# Patient Record
Sex: Female | Born: 1965 | Race: White | Hispanic: No | Marital: Married | Smoking: Never smoker
Health system: Southern US, Community
[De-identification: ages and names within clinical notes are randomized; demographics above are authoritative.]

## PROBLEM LIST (undated history)

## (undated) DIAGNOSIS — M797 Fibromyalgia: Secondary | ICD-10-CM

## (undated) DIAGNOSIS — M1990 Unspecified osteoarthritis, unspecified site: Secondary | ICD-10-CM

## (undated) DIAGNOSIS — F32A Depression, unspecified: Secondary | ICD-10-CM

## (undated) DIAGNOSIS — J45909 Unspecified asthma, uncomplicated: Secondary | ICD-10-CM

## (undated) DIAGNOSIS — F419 Anxiety disorder, unspecified: Secondary | ICD-10-CM

## (undated) DIAGNOSIS — K5190 Ulcerative colitis, unspecified, without complications: Secondary | ICD-10-CM

## (undated) DIAGNOSIS — E785 Hyperlipidemia, unspecified: Secondary | ICD-10-CM

## (undated) HISTORY — PX: BREAST ENHANCEMENT SURGERY: SHX7

## (undated) HISTORY — PX: VEIN SURGERY: SHX48

---

## 1996-03-01 DIAGNOSIS — J45909 Unspecified asthma, uncomplicated: Secondary | ICD-10-CM | POA: Insufficient documentation

## 1997-11-17 ENCOUNTER — Ambulatory Visit (HOSPITAL_BASED_OUTPATIENT_CLINIC_OR_DEPARTMENT_OTHER): Admission: RE | Admit: 1997-11-17 | Discharge: 1997-11-17 | Payer: Self-pay | Admitting: *Deleted

## 1998-08-11 ENCOUNTER — Other Ambulatory Visit: Admission: RE | Admit: 1998-08-11 | Discharge: 1998-08-11 | Payer: Self-pay | Admitting: Obstetrics and Gynecology

## 1999-01-14 ENCOUNTER — Inpatient Hospital Stay (HOSPITAL_COMMUNITY): Admission: AD | Admit: 1999-01-14 | Discharge: 1999-01-14 | Payer: Self-pay | Admitting: Obstetrics and Gynecology

## 1999-02-09 ENCOUNTER — Observation Stay (HOSPITAL_COMMUNITY): Admission: AD | Admit: 1999-02-09 | Discharge: 1999-02-10 | Payer: Self-pay | Admitting: Obstetrics and Gynecology

## 1999-02-11 ENCOUNTER — Inpatient Hospital Stay (HOSPITAL_COMMUNITY): Admission: AD | Admit: 1999-02-11 | Discharge: 1999-02-13 | Payer: Self-pay | Admitting: Obstetrics & Gynecology

## 1999-04-14 ENCOUNTER — Other Ambulatory Visit: Admission: RE | Admit: 1999-04-14 | Discharge: 1999-04-14 | Payer: Self-pay | Admitting: Obstetrics and Gynecology

## 2000-05-13 ENCOUNTER — Other Ambulatory Visit: Admission: RE | Admit: 2000-05-13 | Discharge: 2000-05-13 | Payer: Self-pay | Admitting: Obstetrics and Gynecology

## 2001-07-03 ENCOUNTER — Other Ambulatory Visit: Admission: RE | Admit: 2001-07-03 | Discharge: 2001-07-03 | Payer: Self-pay | Admitting: Obstetrics and Gynecology

## 2002-02-19 DIAGNOSIS — K5190 Ulcerative colitis, unspecified, without complications: Secondary | ICD-10-CM | POA: Insufficient documentation

## 2002-08-12 ENCOUNTER — Other Ambulatory Visit: Admission: RE | Admit: 2002-08-12 | Discharge: 2002-08-12 | Payer: Self-pay | Admitting: Obstetrics and Gynecology

## 2003-10-25 ENCOUNTER — Other Ambulatory Visit: Admission: RE | Admit: 2003-10-25 | Discharge: 2003-10-25 | Payer: Self-pay | Admitting: Obstetrics and Gynecology

## 2005-01-03 ENCOUNTER — Other Ambulatory Visit: Admission: RE | Admit: 2005-01-03 | Discharge: 2005-01-03 | Payer: Self-pay | Admitting: Obstetrics and Gynecology

## 2007-05-26 ENCOUNTER — Encounter: Admission: RE | Admit: 2007-05-26 | Discharge: 2007-05-26 | Payer: Self-pay | Admitting: Rheumatology

## 2007-08-14 DIAGNOSIS — M797 Fibromyalgia: Secondary | ICD-10-CM | POA: Insufficient documentation

## 2010-04-11 DIAGNOSIS — I872 Venous insufficiency (chronic) (peripheral): Secondary | ICD-10-CM | POA: Insufficient documentation

## 2011-07-02 DIAGNOSIS — F419 Anxiety disorder, unspecified: Secondary | ICD-10-CM | POA: Insufficient documentation

## 2017-03-01 DIAGNOSIS — K639 Disease of intestine, unspecified: Secondary | ICD-10-CM | POA: Insufficient documentation

## 2018-08-16 DIAGNOSIS — M810 Age-related osteoporosis without current pathological fracture: Secondary | ICD-10-CM | POA: Insufficient documentation

## 2020-05-27 ENCOUNTER — Encounter (INDEPENDENT_AMBULATORY_CARE_PROVIDER_SITE_OTHER): Payer: Self-pay

## 2020-05-27 ENCOUNTER — Other Ambulatory Visit: Payer: Self-pay | Admitting: Internal Medicine

## 2020-05-27 ENCOUNTER — Ambulatory Visit
Admission: RE | Admit: 2020-05-27 | Discharge: 2020-05-27 | Disposition: A | Discharge status: Home / Self Care | Payer: BC Managed Care – PPO | Source: Ambulatory Visit | Attending: Internal Medicine | Admitting: Internal Medicine

## 2020-05-27 DIAGNOSIS — M19079 Primary osteoarthritis, unspecified ankle and foot: Secondary | ICD-10-CM

## 2021-03-10 ENCOUNTER — Emergency Department (HOSPITAL_BASED_OUTPATIENT_CLINIC_OR_DEPARTMENT_OTHER)
Admission: EM | Admit: 2021-03-10 | Discharge: 2021-03-10 | Disposition: A | Discharge status: Home / Self Care | Payer: BC Managed Care – PPO | Attending: Emergency Medicine | Admitting: Emergency Medicine

## 2021-03-10 ENCOUNTER — Other Ambulatory Visit: Payer: Self-pay

## 2021-03-10 ENCOUNTER — Emergency Department (HOSPITAL_BASED_OUTPATIENT_CLINIC_OR_DEPARTMENT_OTHER): Payer: BC Managed Care – PPO

## 2021-03-10 ENCOUNTER — Encounter (HOSPITAL_BASED_OUTPATIENT_CLINIC_OR_DEPARTMENT_OTHER): Payer: Self-pay

## 2021-03-10 ENCOUNTER — Other Ambulatory Visit (HOSPITAL_BASED_OUTPATIENT_CLINIC_OR_DEPARTMENT_OTHER): Payer: Self-pay

## 2021-03-10 DIAGNOSIS — S3991XA Unspecified injury of abdomen, initial encounter: Secondary | ICD-10-CM | POA: Diagnosis not present

## 2021-03-10 DIAGNOSIS — W19XXXA Unspecified fall, initial encounter: Secondary | ICD-10-CM | POA: Diagnosis not present

## 2021-03-10 DIAGNOSIS — R1012 Left upper quadrant pain: Secondary | ICD-10-CM

## 2021-03-10 HISTORY — DX: Ulcerative colitis, unspecified, without complications: K51.90

## 2021-03-10 HISTORY — DX: Hyperlipidemia, unspecified: E78.5

## 2021-03-10 HISTORY — DX: Unspecified osteoarthritis, unspecified site: M19.90

## 2021-03-10 HISTORY — DX: Anxiety disorder, unspecified: F41.9

## 2021-03-10 HISTORY — DX: Depression, unspecified: F32.A

## 2021-03-10 HISTORY — DX: Unspecified asthma, uncomplicated: J45.909

## 2021-03-10 HISTORY — DX: Fibromyalgia: M79.7

## 2021-03-10 LAB — COMPREHENSIVE METABOLIC PANEL
ALT: 23 U/L (ref 0–44)
AST: 27 U/L (ref 15–41)
Albumin: 4.2 g/dL (ref 3.5–5.0)
Alkaline Phosphatase: 57 U/L (ref 38–126)
Anion gap: 6 (ref 5–15)
BUN: 9 mg/dL (ref 6–20)
CO2: 29 mmol/L (ref 22–32)
Calcium: 9.7 mg/dL (ref 8.9–10.3)
Chloride: 103 mmol/L (ref 98–111)
Creatinine, Ser: 0.67 mg/dL (ref 0.44–1.00)
GFR, Estimated: >60 mL/min (ref >60)
Glucose, Bld: 103 mg/dL — ABNORMAL HIGH (ref 70–99)
Potassium: 3.9 mmol/L (ref 3.5–5.1)
Sodium: 138 mmol/L (ref 135–145)
Total Bilirubin: 0.5 mg/dL (ref 0.3–1.2)
Total Protein: 7.5 g/dL (ref 6.5–8.1)

## 2021-03-10 LAB — CBC
HCT: 39 % (ref 36.0–46.0)
Hemoglobin: 12.7 g/dL (ref 12.0–15.0)
MCH: 30.6 pg (ref 26.0–34.0)
MCHC: 32.6 g/dL (ref 30.0–36.0)
MCV: 94 fL (ref 80.0–100.0)
Platelets: 272 10*3/uL (ref 150–400)
RBC: 4.15 MIL/uL (ref 3.87–5.11)
RDW: 13.7 % (ref 11.5–15.5)
WBC: 6.4 10*3/uL (ref 4.0–10.5)
nRBC: 0 % (ref 0.0–0.2)

## 2021-03-10 LAB — URINALYSIS, MICROSCOPIC (REFLEX): Bacteria, UA: NONE SEEN

## 2021-03-10 LAB — URINALYSIS, ROUTINE W REFLEX MICROSCOPIC
Bilirubin Urine: NEGATIVE
Glucose, UA: NEGATIVE mg/dL
Ketones, ur: NEGATIVE mg/dL
Leukocytes,Ua: NEGATIVE
Nitrite: NEGATIVE
Protein, ur: NEGATIVE mg/dL
Specific Gravity, Urine: 1.025 (ref 1.005–1.030)
pH: 6 (ref 5.0–8.0)

## 2021-03-10 LAB — LIPASE, BLOOD: Lipase: 33 U/L (ref 11–51)

## 2021-03-10 LAB — PREGNANCY, URINE: Preg Test, Ur: NEGATIVE

## 2021-03-10 MED ORDER — IOHEXOL 300 MG/ML  SOLN
100.0000 mL | Freq: Once | INTRAMUSCULAR | Status: AC | PRN
  Administered 2021-03-10: 100 mL via INTRAVENOUS

## 2021-03-10 MED ORDER — HYDROCODONE-ACETAMINOPHEN 5-325 MG PO TABS
1.0000 | ORAL_TABLET | ORAL | 0 refills | Status: DC | PRN
  Filled 2021-03-10: qty 10, 2d supply, fill #0

## 2021-03-10 NOTE — ED Triage Notes (Signed)
Pt c/o pain to left abd and flank-states she was reaching into a tall garbage bin "I caught the edge anf it popped into me" 2/20-NAD-steady gait

## 2021-03-10 NOTE — ED Provider Notes (Signed)
[facility] EMERGENCY DEPARTMENT Provider Note   CSN: [id number] Arrival date & time: 03/10/21  1250     History  Chief Complaint  Patient presents with   Abdominal Injury    Suzanne Dawson is a 56 y.o. female.  Pt is a 56 yo wf with a hx of UC, fibromyalgia, anxiety, and depression.  Pt said she was cleaning out her large trash can and she half fell into it with the lip of the trash can hitting her LUQ.  Pt said this happened on 2/20 and it is still hurting her.  No other sx.  She has taken ibuprofen which has helped, but she is not supposed to take NSAIDS due to her UC.  She has taken tylenol, but it has not helped.      Home Medications Prior to Admission medications   Medication Sig Start Date End Date Taking? Authorizing Provider  HYDROcodone-acetaminophen (NORCO/VICODIN) 5-325 MG tablet Take 1 tablet by mouth every 4 (four) hours as needed. 03/10/21  Yes Jacalyn Lefevre, MD      Allergies    Amoxil [amoxicillin], Colazal [balsalazide], and Imuran [azathioprine]    Review of Systems   Review of Systems  Gastrointestinal:  Positive for abdominal pain.  All other systems reviewed and are negative.  Physical Exam Updated Vital Signs BP (!) 152/73 (BP Location: Left Arm)    Pulse 88    Temp 98.7 F (37.1 C) (Oral)    Resp 18    Ht 5\' 5"  (1.651 m)    Wt 64.9 kg    SpO2 100%    BMI 23.80 kg/m  Physical Exam Vitals and nursing note reviewed.  Constitutional:      Appearance: Normal appearance.  HENT:     Head: Normocephalic and atraumatic.     Right Ear: External ear normal.     Left Ear: External ear normal.     Nose: Nose normal.     Mouth/Throat:     Mouth: Mucous membranes are moist.     Pharynx: Oropharynx is clear.  Eyes:     Extraocular Movements: Extraocular movements intact.     Conjunctiva/sclera: Conjunctivae normal.     Pupils: Pupils are equal, round, and reactive to light.  Cardiovascular:     Rate and Rhythm: Normal rate and regular  rhythm.     Pulses: Normal pulses.     Heart sounds: Normal heart sounds.  Pulmonary:     Effort: Pulmonary effort is normal.     Breath sounds: Normal breath sounds.  Abdominal:     General: Abdomen is flat. Bowel sounds are normal.     Palpations: Abdomen is soft.     Tenderness: There is abdominal tenderness in the left upper quadrant.  Musculoskeletal:        General: Normal range of motion.     Cervical back: Normal range of motion and neck supple.  Skin:    General: Skin is warm.     Capillary Refill: Capillary refill takes less than 2 seconds.  Neurological:     General: No focal deficit present.     Mental Status: She is alert and oriented to person, place, and time.  Psychiatric:        Mood and Affect: Mood normal.        Behavior: Behavior normal.    ED Results / Procedures / Treatments   Labs (all labs ordered are listed, but only abnormal results are displayed) Labs Reviewed  COMPREHENSIVE METABOLIC  PANEL - Abnormal; Notable for the following components:      Result Value   Glucose, Bld 103 (*)    All other components within normal limits  URINALYSIS, ROUTINE W REFLEX MICROSCOPIC - Abnormal; Notable for the following components:   Hgb urine dipstick SMALL (*)    All other components within normal limits  LIPASE, BLOOD  CBC  PREGNANCY, URINE  URINALYSIS, MICROSCOPIC (REFLEX)    EKG None  Radiology CT ABDOMEN PELVIS W CONTRAST  Result Date: 03/10/2021 CLINICAL DATA:  Abdominal trauma, blunt EXAM: CT ABDOMEN AND PELVIS WITH CONTRAST TECHNIQUE: Multidetector CT imaging of the abdomen and pelvis was performed using the standard protocol following bolus administration of intravenous contrast. RADIATION DOSE REDUCTION: This exam was performed according to the departmental dose-optimization program which includes automated exposure control, adjustment of the mA and/or kV according to patient size and/or use of iterative reconstruction technique. CONTRAST:  142mL  OMNIPAQUE IOHEXOL 300 MG/ML  SOLN COMPARISON:  None. FINDINGS: Lower chest: No acute abnormality. Bilateral breast implants partially visualized. Hepatobiliary: No focal liver abnormality is seen. The gallbladder is unremarkable. Pancreas: Unremarkable. No pancreatic ductal dilatation or surrounding inflammatory changes. Spleen: Spleen is normal in size with multiple clefts. Probable large adjacent splenule. Adrenals/Urinary Tract: Adrenal glands are unremarkable. No hydronephrosis or nephrolithiasis. Bladder is unremarkable. Stomach/Bowel: The stomach is within normal limits. There is no evidence of bowel obstruction.The appendix is normal. Redundant sigmoid colon. Moderate stool burden in the ascending and transverse colon. Vascular/Lymphatic: No significant vascular findings are present. No enlarged abdominal or pelvic lymph nodes. Reproductive: Unremarkable. Other: No abdominal wall hernia or abnormality. No abdominopelvic ascites. Musculoskeletal: Moderate to severe disc height loss at L5-S1. There is no acute osseous abnormality. No suspicious lytic or blastic lesions. IMPRESSION: No acute abdominopelvic abnormality. Electronically Signed   By: Maurine Simmering M.D.   On: 03/10/2021 14:43    Procedures Procedures    Medications Ordered in ED Medications  iohexol (OMNIPAQUE) 300 MG/ML solution 100 mL (100 mLs Intravenous Contrast Given 03/10/21 1421)    ED Course/ Medical Decision Making/ A&P                           Medical Decision Making Amount and/or Complexity of Data Reviewed Labs: ordered. Radiology: ordered.  Risk Prescription drug management.   This patient presents to the ED for concern of abd pain, this involves an extensive number of treatment options, and is a complaint that carries with it a high risk of complications and morbidity.  The differential diagnosis includes internal injury, contusion, infection   Co morbidities that complicate the patient evaluation  Ulcerative  colitis on humira   Additional history obtained:  Additional history obtained from epic chart review  Lab Tests:  I Ordered, and personally interpreted labs.  The pertinent results include:  cmp nl, cbc nl, ua neg, preg neg, lipase nl   Imaging Studies ordered:  I ordered imaging studies including ct abd/pelvis  I independently visualized and interpreted imaging which showed    IMPRESSION:  No acute abdominopelvic abnormality.   I agree with the radiologist interpretation   Cardiac Monitoring:  The patient was maintained on a cardiac monitor.  I personally viewed and interpreted the cardiac monitored which showed an underlying rhythm of: nsr   Medicines ordered and prescription drug management:  I have reviewed the patients home medicines and have made adjustments as needed   Problem List / ED Course:  Abd  pain: contusion. She is d/c with lortab (#10).  She knows they can cause constipation and is to take colace or other stool softener.    Reevaluation:  After the interventions noted above, I reevaluated the patient and found that they have :improved   Social Determinants of Health:  Lives at home with husband   Dispostion:  After consideration of the diagnostic results and the patients response to treatment, I feel that the patent would benefit from discharge with outpatient f/u.  Return if worse.           Final Clinical Impression(s) / ED Diagnoses Final diagnoses:  Left upper quadrant abdominal pain  Blunt trauma to abdomen, initial encounter    Rx / DC Orders ED Discharge Orders          Ordered    HYDROcodone-acetaminophen (NORCO/VICODIN) 5-325 MG tablet  Every 4 hours PRN        03/10/21 1456              Isla Pence, MD 03/10/21 1525

## 2022-12-20 LAB — HM DEXA SCAN

## 2023-02-06 NOTE — Progress Notes (Signed)
   I, Stevenson Clinch, CMA acting as a scribe for Clementeen Graham, MD.  Suzanne Dawson is a 58 y.o. female who presents to [facility] today for osteoporosis management. Family hx of cancer and osteoporosis. Hx of long term steroids.   DEXA scan (date, T-score): 12/20/22: Spine= -3.0, L-FN= -2.4, R-FN= -1.9 Prior treatment: no History of Hip, Spine, or Wrist Fx: no Heart disease or stroke: no Cancer: none personally Kidney Disease: no Gastric/Peptic Ulcer: ulcerative colitis Gastric bypass surgery: no Severe GERD: YES - Nexium Hx of seizures: no Age at Menopause: 58 y/o Calcium intake: yes- 500mg  Vitamin D intake: yes- 1000iU Hormone replacement therapy: estradiol patch - current Smoking history: never smoked Alcohol: once weekly, seasonal, socially Exercise: moderate - resistance training.  Major dental work in past year: no, planned dental surgery in the coming.  Parents with hip/spine fracture: mom, hip Height loss: yes: 66" to 65.5"   Pertinent review of systems: No fevers or chills  Relevant historical information: Ulcerative colitis treated with Humira.  Sleep apnea anxiety fibromyalgia.   Exam:  BP 110/82   Pulse 77   Ht 5' 5.5" (1.664 m)   Wt 142 lb (64.4 kg)   SpO2 98%   BMI 23.27 kg/m  General: Well Developed, well nourished, and in no acute distress.   MSK: Normal lumbar motion.       Assessment and Plan: 58 y.o. female with osteoporosis with T-score -3.0 lumbar spine.  She already has been doing a good job with conservative management for her osteoporosis with weightbearing exercise and optimizing vitamin D and calcium.  Her exposure to steroids in the past may be a factor for osteoporosis as his age and genetics.  We talked about her medication options plan for Prolia.  Will check labs in preparation for Prolia and as well as checking vitamin D.   PDMP not reviewed this encounter. Orders Placed This Encounter  Procedures    Comprehensive metabolic panel    Osteoporis    Standing Status:   Future    Number of Occurrences:   1    Expiration Date:   02/07/2024   Magnesium    Therapeutic drug monitoring    Standing Status:   Future    Number of Occurrences:   1    Expiration Date:   02/07/2024   VITAMIN D 25 Hydroxy (Vit-D Deficiency, Fractures)    Osteoporis    Standing Status:   Future    Number of Occurrences:   1    Expiration Date:   02/07/2024   Phosphorus    Osteroporis    Standing Status:   Future    Number of Occurrences:   1    Expiration Date:   02/07/2024   No orders of the defined types were placed in this encounter.    Discussed warning signs or symptoms. Please see discharge instructions. Patient expresses understanding.   The above documentation has been reviewed and is accurate and complete Clementeen Graham, M.D.

## 2023-02-07 ENCOUNTER — Encounter: Payer: Self-pay | Admitting: Family Medicine

## 2023-02-07 ENCOUNTER — Ambulatory Visit: Payer: 59 | Admitting: Family Medicine

## 2023-02-07 VITALS — BP 110/82 | HR 77 | Ht 65.5 in | Wt 142.0 lb

## 2023-02-07 DIAGNOSIS — M1990 Unspecified osteoarthritis, unspecified site: Secondary | ICD-10-CM | POA: Insufficient documentation

## 2023-02-07 DIAGNOSIS — M810 Age-related osteoporosis without current pathological fracture: Secondary | ICD-10-CM

## 2023-02-07 DIAGNOSIS — G4733 Obstructive sleep apnea (adult) (pediatric): Secondary | ICD-10-CM | POA: Insufficient documentation

## 2023-02-07 DIAGNOSIS — G4700 Insomnia, unspecified: Secondary | ICD-10-CM | POA: Insufficient documentation

## 2023-02-07 LAB — COMPREHENSIVE METABOLIC PANEL
ALT: 15 U/L (ref 0–35)
AST: 18 U/L (ref 0–37)
Albumin: 4.4 g/dL (ref 3.5–5.2)
Alkaline Phosphatase: 39 U/L (ref 39–117)
BUN: 10 mg/dL (ref 6–23)
CO2: 31 meq/L (ref 19–32)
Calcium: 9.6 mg/dL (ref 8.4–10.5)
Chloride: 102 meq/L (ref 96–112)
Creatinine, Ser: 0.68 mg/dL (ref 0.40–1.20)
GFR: 96.32 mL/min (ref >60.00)
Glucose, Bld: 98 mg/dL (ref 70–99)
Potassium: 3.7 meq/L (ref 3.5–5.1)
Sodium: 139 meq/L (ref 135–145)
Total Bilirubin: 0.3 mg/dL (ref 0.2–1.2)
Total Protein: 7.2 g/dL (ref 6.0–8.3)

## 2023-02-07 LAB — MAGNESIUM: Magnesium: 1.8 mg/dL (ref 1.5–2.5)

## 2023-02-07 LAB — VITAMIN D 25 HYDROXY (VIT D DEFICIENCY, FRACTURES): VITD: 47.48 ng/mL (ref 30.00–100.00)

## 2023-02-07 LAB — PHOSPHORUS: Phosphorus: 3.8 mg/dL (ref 2.3–4.6)

## 2023-02-07 NOTE — Patient Instructions (Signed)
Thank you for coming in today.   Please get labs today before you leave

## 2023-02-08 ENCOUNTER — Encounter: Payer: Self-pay | Admitting: Family Medicine

## 2023-02-08 NOTE — Progress Notes (Signed)
Labs look okay.  Continue current calcium and vitamin D dosing.  Okay to proceed to Prolia when we get it authorized.

## 2023-02-13 ENCOUNTER — Telehealth: Payer: Self-pay

## 2023-02-13 DIAGNOSIS — M810 Age-related osteoporosis without current pathological fracture: Secondary | ICD-10-CM

## 2023-02-13 NOTE — Telephone Encounter (Signed)
Order placed for Prolia through [facility] [employer] [facility].

## 2023-02-15 ENCOUNTER — Telehealth: Payer: Self-pay

## 2023-02-15 ENCOUNTER — Encounter: Payer: Self-pay | Admitting: Family Medicine

## 2023-02-15 ENCOUNTER — Other Ambulatory Visit (HOSPITAL_COMMUNITY): Payer: Self-pay | Admitting: Family Medicine

## 2023-02-15 DIAGNOSIS — M810 Age-related osteoporosis without current pathological fracture: Secondary | ICD-10-CM

## 2023-02-15 NOTE — Telephone Encounter (Signed)
Hello,  Auth Submission: APPROVED Site of care: Site of care: [facility] Payer: [employer] Medication & CPT/J Code(s) submitted: Prolia (Denosumab) [id number] Route of submission (phone, fax, portal): portal Phone # Fax # Auth type: Buy/Bill PB Units/visits requested: 60mg , q107months Reference number: [id number] Approval from: 02/13/23 to 1/28\/26

## 2023-02-18 NOTE — Telephone Encounter (Signed)
 Noted

## 2023-02-18 NOTE — Telephone Encounter (Signed)
 Scheduled 02/19/23

## 2023-02-18 NOTE — Telephone Encounter (Signed)
Wynelle Link, CPhT     02/15/23 10:28 AM Note Hello,   Auth Submission: APPROVED Site of care: Site of care: [facility] Payer: [employer] Medication & CPT/J Code(s) submitted: Prolia (Denosumab) [id number] Route of submission (phone, fax, portal): portal Phone # Fax # Auth type: Buy/Bill PB Units/visits requested: 60mg , q33months Reference number: [id number] Approval from: 02/13/23 to 1/28\/26

## 2023-02-18 NOTE — Telephone Encounter (Signed)
 Forwarding to Dr. Denyse Amass to review and advise.

## 2023-02-19 ENCOUNTER — Inpatient Hospital Stay (HOSPITAL_COMMUNITY): Admission: RE | Admit: 2023-02-19 | Payer: 59 | Source: Ambulatory Visit

## 2023-02-19 NOTE — Telephone Encounter (Signed)
Pharmacy Benefit Prior Auth initiated via [employer] through pharmacy benefits for pt to administer at home.

## 2023-02-22 MED ORDER — DENOSUMAB 60 MG/ML ~~LOC~~ SOSY: 60.0000 mg | PREFILLED_SYRINGE | Freq: Once | SUBCUTANEOUS | 0 refills | Status: AC

## 2023-02-22 NOTE — Addendum Note (Signed)
 Addended by: Charle Congo on: 02/22/2023 11:16 AM   Modules accepted: Orders

## 2023-02-22 NOTE — Telephone Encounter (Signed)
 Prolia  approved through pharmacy benefits PA# [id number] Valid: 02/19/23-02/18/24

## 2023-02-22 NOTE — Telephone Encounter (Addendum)
 Called pt and advised of approval. Rx sent to [facility] per pt request. She will contact the office if she runs in to any issues when picking up rx.   Also advised pt to f/u with Dr. Alease Hunter once yearly.

## 2023-03-29 IMAGING — DX DG ANKLE COMPLETE 3+V*L*
3 series · 3 of 3 positions shown · non-contrast
Comparison: None.

CLINICAL DATA: Bilateral ankle pain, initial encounter

EXAM:
LEFT ANKLE COMPLETE - 3+ VIEW

[dg ankle complete left (1 of 3)]
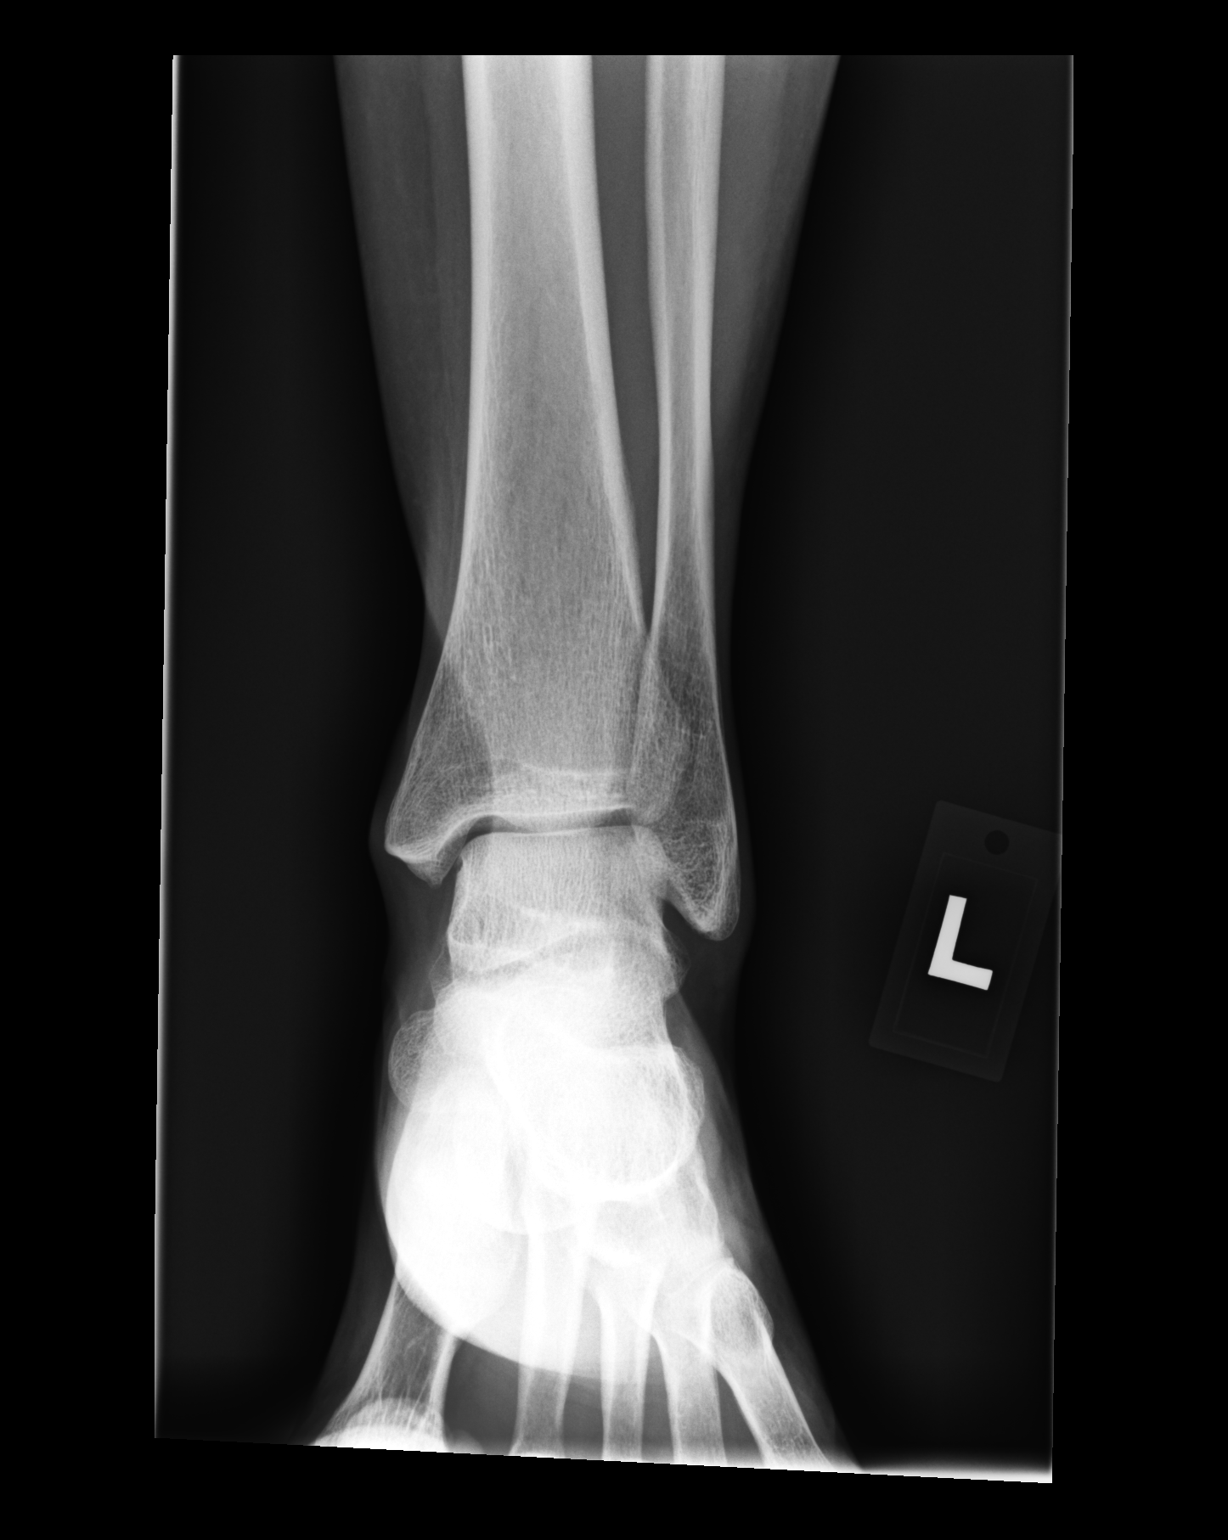

[dg ankle complete left (2 of 3)]
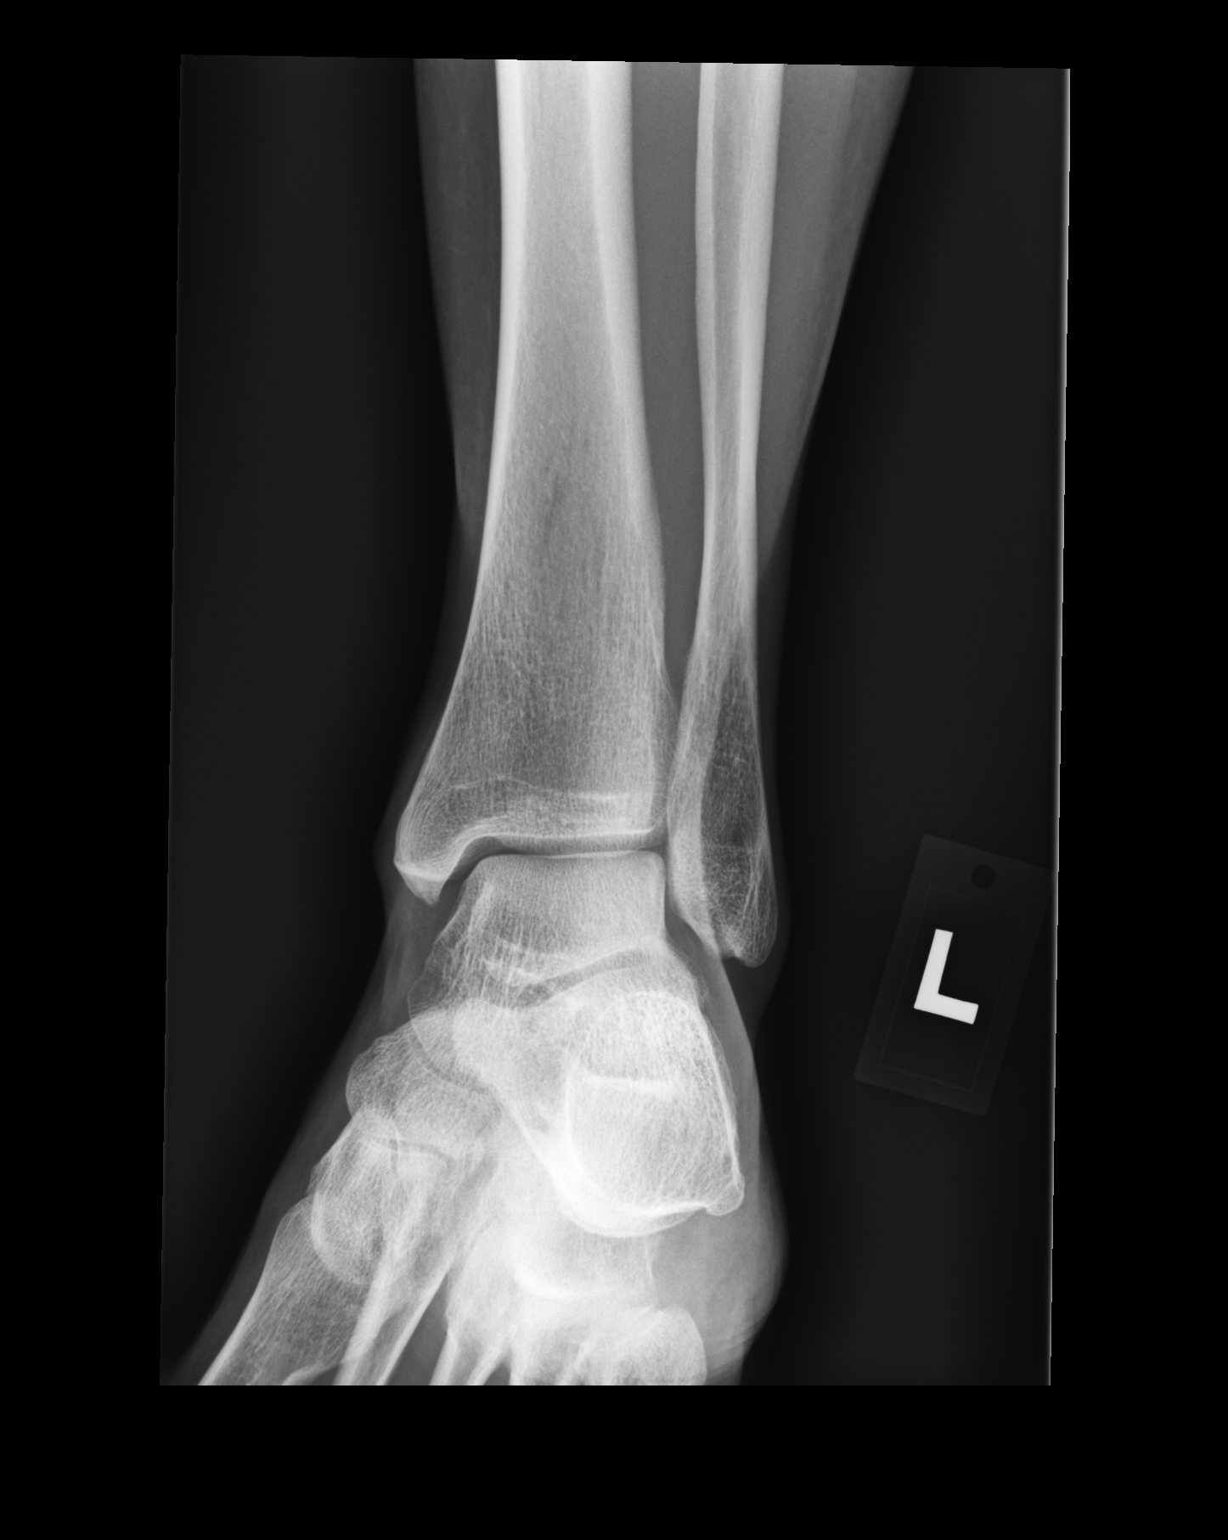

[dg ankle complete left (3 of 3)]
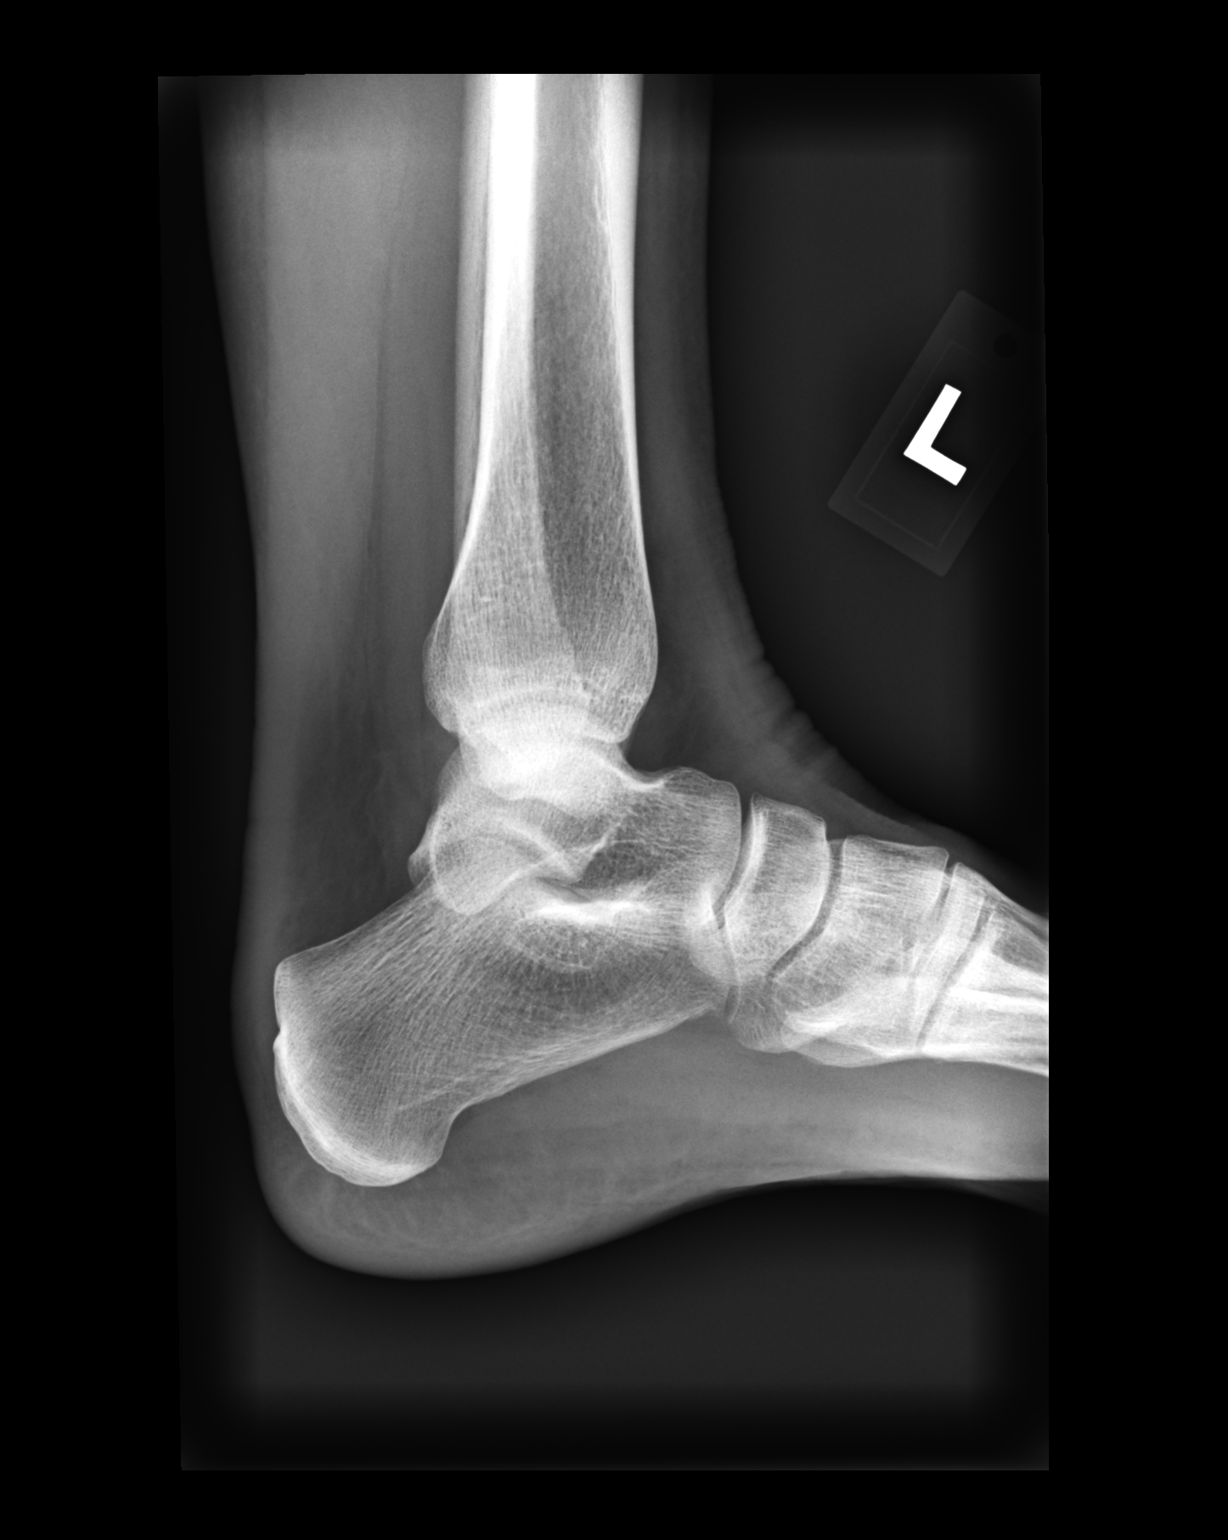

[3 of 3 positions shown; findings below may reference images not displayed]

FINDINGS: There is no evidence of fracture, dislocation, or joint effusion.
There is no evidence of arthropathy or other focal bone abnormality.
Soft tissues are unremarkable.
IMPRESSION: No acute abnormality noted.

## 2023-04-10 NOTE — Telephone Encounter (Signed)
 Spoke with [facility] and was advised that the FDA has not approved Prolia for self-administration, he wanted to make sure that we are aware.   Forwarding to Dr. Denyse Amass to review.

## 2023-08-05 NOTE — Telephone Encounter (Signed)
 Prolia  VOB initiated via [email or web address]  Next Prolia  inj DUE: possibly 08/23/23

## 2023-08-07 NOTE — Telephone Encounter (Signed)
 [facility] [employer] [facility] [employer] - Prior Authorization REQUIRED for [employer]

## 2023-08-20 ENCOUNTER — Encounter (HOSPITAL_COMMUNITY): Payer: 59

## 2023-11-28 ENCOUNTER — Emergency Department (HOSPITAL_BASED_OUTPATIENT_CLINIC_OR_DEPARTMENT_OTHER)
Admission: EM | Admit: 2023-11-28 | Discharge: 2023-11-28 | Disposition: A | Discharge status: Home / Self Care | Attending: Emergency Medicine | Admitting: Emergency Medicine

## 2023-11-28 ENCOUNTER — Emergency Department (HOSPITAL_BASED_OUTPATIENT_CLINIC_OR_DEPARTMENT_OTHER)

## 2023-11-28 ENCOUNTER — Other Ambulatory Visit: Payer: Self-pay

## 2023-11-28 DIAGNOSIS — J45909 Unspecified asthma, uncomplicated: Secondary | ICD-10-CM | POA: Insufficient documentation

## 2023-11-28 DIAGNOSIS — K529 Noninfective gastroenteritis and colitis, unspecified: Secondary | ICD-10-CM | POA: Diagnosis not present

## 2023-11-28 DIAGNOSIS — Z7951 Long term (current) use of inhaled steroids: Secondary | ICD-10-CM | POA: Diagnosis not present

## 2023-11-28 DIAGNOSIS — R112 Nausea with vomiting, unspecified: Secondary | ICD-10-CM | POA: Diagnosis present

## 2023-11-28 LAB — URINALYSIS, MICROSCOPIC (REFLEX)

## 2023-11-28 LAB — URINALYSIS, ROUTINE W REFLEX MICROSCOPIC
Bilirubin Urine: NEGATIVE
Glucose, UA: NEGATIVE mg/dL
Ketones, ur: 40 mg/dL — AB
Leukocytes,Ua: NEGATIVE
Nitrite: NEGATIVE
Protein, ur: NEGATIVE mg/dL
Specific Gravity, Urine: 1.025 (ref 1.005–1.030)
pH: 6 (ref 5.0–8.0)

## 2023-11-28 LAB — COMPREHENSIVE METABOLIC PANEL WITH GFR
ALT: 13 U/L (ref 0–44)
AST: 19 U/L (ref 15–41)
Albumin: 4.7 g/dL (ref 3.5–5.0)
Alkaline Phosphatase: 60 U/L (ref 38–126)
Anion gap: 12 (ref 5–15)
BUN: 8 mg/dL (ref 6–20)
CO2: 24 mmol/L (ref 22–32)
Calcium: 9.8 mg/dL (ref 8.9–10.3)
Chloride: 102 mmol/L (ref 98–111)
Creatinine, Ser: 0.66 mg/dL (ref 0.44–1.00)
GFR, Estimated: >60 mL/min (ref >60)
Glucose, Bld: 130 mg/dL — ABNORMAL HIGH (ref 70–99)
Potassium: 4.4 mmol/L (ref 3.5–5.1)
Sodium: 139 mmol/L (ref 135–145)
Total Bilirubin: 0.5 mg/dL (ref 0.0–1.2)
Total Protein: 7.7 g/dL (ref 6.5–8.1)

## 2023-11-28 LAB — CBC
HCT: 38.7 % (ref 36.0–46.0)
Hemoglobin: 12.9 g/dL (ref 12.0–15.0)
MCH: 30.7 pg (ref 26.0–34.0)
MCHC: 33.3 g/dL (ref 30.0–36.0)
MCV: 92.1 fL (ref 80.0–100.0)
Platelets: 276 K/uL (ref 150–400)
RBC: 4.2 MIL/uL (ref 3.87–5.11)
RDW: 14.5 % (ref 11.5–15.5)
WBC: 6.9 K/uL (ref 4.0–10.5)
nRBC: 0 % (ref 0.0–0.2)

## 2023-11-28 LAB — LIPASE, BLOOD: Lipase: 19 U/L (ref 11–51)

## 2023-11-28 MED ORDER — ONDANSETRON HCL 4 MG/2ML IJ SOLN
4.0000 mg | Freq: Once | INTRAMUSCULAR | Status: AC
  Administered 2023-11-28: 4 mg via INTRAVENOUS
  Filled 2023-11-28: qty 2

## 2023-11-28 MED ORDER — MORPHINE SULFATE (PF) 4 MG/ML IV SOLN
4.0000 mg | Freq: Once | INTRAVENOUS | Status: AC
  Administered 2023-11-28: 4 mg via INTRAVENOUS
  Filled 2023-11-28: qty 1

## 2023-11-28 MED ORDER — HYDROCODONE-ACETAMINOPHEN 5-325 MG PO TABS: 1.0000 | ORAL_TABLET | ORAL | 0 refills | Status: AC | PRN

## 2023-11-28 MED ORDER — SODIUM CHLORIDE 0.9 % IV BOLUS
1000.0000 mL | Freq: Once | INTRAVENOUS | Status: AC
  Administered 2023-11-28: 1000 mL via INTRAVENOUS

## 2023-11-28 MED ORDER — IOHEXOL 300 MG/ML  SOLN
100.0000 mL | Freq: Once | INTRAMUSCULAR | Status: AC | PRN
  Administered 2023-11-28: 100 mL via INTRAVENOUS

## 2023-11-28 MED ORDER — METOCLOPRAMIDE HCL 5 MG/ML IJ SOLN
10.0000 mg | Freq: Once | INTRAMUSCULAR | Status: AC
  Administered 2023-11-28: 10 mg via INTRAVENOUS
  Filled 2023-11-28: qty 2

## 2023-11-28 MED ORDER — PREDNISONE 20 MG PO TABS
ORAL_TABLET | ORAL | 0 refills | Status: AC
Start: 2023-11-28 — End: ?

## 2023-11-28 MED ORDER — METHYLPREDNISOLONE SODIUM SUCC 125 MG IJ SOLR
125.0000 mg | Freq: Once | INTRAMUSCULAR | Status: AC
  Administered 2023-11-28: 125 mg via INTRAVENOUS
  Filled 2023-11-28: qty 2

## 2023-11-28 MED ORDER — HYDROCODONE-ACETAMINOPHEN 5-325 MG PO TABS
2.0000 | ORAL_TABLET | Freq: Once | ORAL | Status: AC
  Administered 2023-11-28: 2 via ORAL
  Filled 2023-11-28: qty 2

## 2023-11-28 NOTE — ED Triage Notes (Signed)
 Pt reports being halfway through colonoscopy prep and having headache as well as anxiety, vomiting, and abdominal pain.  Pt reports hx of ulcerative colitis.

## 2023-11-28 NOTE — ED Provider Notes (Signed)
 [facility] Provider Note   CSN: [id number] Arrival date & time: 11/28/23  1531     Patient presents with: Dehydration and Abdominal Pain   PANZY BUBECK is a 58 y.o. female.   Patient is a 58 year old female with a history of ulcerative colitis who presents with abdominal pain, nausea and vomiting after starting a colonoscopy prep.  She was being scheduled for routine colonoscopy for her ulcerative colitis follow-up.  She was going to do a 2-day colonoscopy prep and started with some Dulcolax yesterday afternoon.  She started having some bowel movements yesterday evening.  Today as she was continuing the MiraLAX, she started having nausea and vomiting and was not able to continue with it.  She has had some increased pain in her left abdomen.  This is the area that normally flares up with her ulcerative colitis but it has been worsening through the day.  She denies any fevers.  No noticeable blood in her stool.  No urinary symptoms.  She has had some intermittent dull headaches.       Prior to Admission medications   Medication Sig Start Date End Date Taking? Authorizing Provider  HYDROcodone -acetaminophen  (NORCO/VICODIN) 5-325 MG tablet Take 1-2 tablets by mouth every 4 (four) hours as needed. 11/28/23  Yes Lenor Hollering, MD  predniSONE (DELTASONE) 20 MG tablet 2 tabs po daily x 4 days 11/28/23  Yes Lenor Hollering, MD  albuterol (VENTOLIN HFA) 108 (90 Base) MCG/ACT inhaler Pt states PRN. 01/27/1996   [provider]  ALPRAZolam SHEFFIELD) 1 MG tablet  01/16/18   [provider]  butalbital-acetaminophen -caffeine (FIORICET) 50-325-40 MG tablet Take by mouth. 03/09/15   [provider]  calcium-vitamin D  (OSCAL WITH D) 500-5 MG-MCG tablet Take 1 tablet by mouth.    [provider]  cariprazine (VRAYLAR) 1.5 MG capsule Take by mouth.    [provider]  dicyclomine (BENTYL) 10 MG capsule Take by mouth.  03/08/15   [provider]  docusate sodium (COLACE) 100 MG capsule  07/16/18   [provider]  DULoxetine (CYMBALTA) 30 MG capsule  09/10/22   [provider]  DULoxetine (CYMBALTA) 60 MG capsule Take by mouth. 02/04/07   [provider]  ergocalciferol  (VITAMIN D2) 1.25 MG (50000 UT) capsule Take 50,000 Units by mouth once a week.    [provider]  esomeprazole (NEXIUM) 20 MG capsule Take 1 capsule every day by oral route. 07/12/21   [provider]  estradiol (VIVELLE-DOT) 0.0375 MG/24HR Dotti 0.0375 mg/24 hr transdermal patch 08/29/20   [provider]  Eszopiclone 3 MG TABS 1 tablet immediately before bedtime 03/01/17   [provider]  fluocinolone (SYNALAR) 0.025 % ointment Apply topically 2 (two) times daily.    [provider]  fluticasone (FLONASE SENSIMIST) 27.5 MCG/SPRAY nasal spray  10/30/15   [provider]  fluticasone-salmeterol (ADVAIR DISKUS) 250-50 MCG/ACT AEPB Inhale into the lungs. 08/16/07   [provider]  loratadine (CLARITIN) 10 MG tablet Take by mouth. 03/08/15   [provider]  Melatonin 10 MG CAPS See admin instructions. 08/29/20   [provider]  ondansetron (ZOFRAN-ODT) 4 MG disintegrating tablet Take by mouth. 04/25/15   [provider]  pregabalin (LYRICA) 100 MG capsule 1 capsule 08/16/07   [provider]  Probiotic, Lactobacillus, CAPS  07/12/21   [provider]  progesterone (PROMETRIUM) 100 MG capsule  08/29/20   [provider]  rosuvastatin (CRESTOR) 20  MG tablet 1 tablet 02/01/21   [provider]  venlafaxine XR (EFFEXOR-XR) 37.5 MG 24 hr capsule daily. 07/30/15   [provider]    Allergies: Amoxil [amoxicillin], Colazal [balsalazide], Hyrimoz [adalimumab], Imuran [azathioprine], and Mesalamine    Review of Systems  Constitutional:  Positive for fatigue. Negative for chills, diaphoresis and  fever.  HENT:  Negative for congestion, rhinorrhea and sneezing.   Eyes: Negative.   Respiratory:  Negative for cough, chest tightness and shortness of breath.   Cardiovascular:  Negative for chest pain and leg swelling.  Gastrointestinal:  Positive for abdominal pain, diarrhea, nausea and vomiting. Negative for blood in stool.  Genitourinary:  Negative for difficulty urinating, flank pain, frequency and hematuria.  Musculoskeletal:  Negative for arthralgias and back pain.  Skin:  Negative for rash.  Neurological:  Positive for headaches. Negative for dizziness, speech difficulty, weakness and numbness.    Updated Vital Signs BP 131/72 (BP Location: Right Arm)   Pulse 73   Temp 98.5 F (36.9 C) (Oral)   Resp 16   SpO2 100%   Physical Exam Constitutional:      Appearance: She is well-developed.  HENT:     Head: Normocephalic and atraumatic.  Eyes:     Pupils: Pupils are equal, round, and reactive to light.  Cardiovascular:     Rate and Rhythm: Normal rate and regular rhythm.     Heart sounds: Normal heart sounds.  Pulmonary:     Effort: Pulmonary effort is normal. No respiratory distress.     Breath sounds: Normal breath sounds. No wheezing or rales.  Chest:     Chest wall: No tenderness.  Abdominal:     General: Bowel sounds are normal.     Palpations: Abdomen is soft.     Tenderness: There is abdominal tenderness in the left upper quadrant. There is no guarding or rebound.  Musculoskeletal:        General: Normal range of motion.     Cervical back: Normal range of motion and neck supple.  Lymphadenopathy:     Cervical: No cervical adenopathy.  Skin:    General: Skin is warm and dry.     Findings: No rash.  Neurological:     Mental Status: She is alert and oriented to person, place, and time.     (all labs ordered are listed, but only abnormal results are displayed) Labs Reviewed  COMPREHENSIVE METABOLIC PANEL WITH GFR - Abnormal; Notable for the following  components:      Result Value   Glucose, Bld 130 (*)    All other components within normal limits  URINALYSIS, ROUTINE W REFLEX MICROSCOPIC - Abnormal; Notable for the following components:   Hgb urine dipstick TRACE (*)    Ketones, ur 40 (*)    All other components within normal limits  URINALYSIS, MICROSCOPIC (REFLEX) - Abnormal; Notable for the following components:   Bacteria, UA MANY (*)    All other components within normal limits  LIPASE, BLOOD  CBC    EKG: None  Radiology: CT ABDOMEN PELVIS W CONTRAST Result Date: 11/28/2023 CLINICAL DATA:  Abdominal pain.  History of ulcerative colitis. EXAM: CT ABDOMEN AND PELVIS WITH CONTRAST TECHNIQUE: Multidetector CT imaging of the abdomen and pelvis was performed using the standard protocol following bolus administration of intravenous contrast. RADIATION DOSE REDUCTION: This exam was performed according to the departmental dose-optimization program which includes automated exposure control, adjustment of the mA and/or kV according to patient size and/or use of  iterative reconstruction technique. CONTRAST:  OMNIPAQUE  IOHEXOL  300 MG/ML  SOLN COMPARISON:  CT dated 03/10/2021. FINDINGS: Lower chest: The visualized lung bases are clear. No intra-abdominal free air.  Trace free fluid in the pelvis. Hepatobiliary: The liver is unremarkable. No biliary dilatation. The gallbladder is unremarkable Pancreas: Unremarkable. No pancreatic ductal dilatation or surrounding inflammatory changes. Spleen: Normal in size without focal abnormality. Adrenals/Urinary Tract: The adrenal glands, kidneys, visualized ureters, and urinary probable Stomach/Bowel: Focal thickening of the distal stomach likely related to underdistention. Gastritis is less likely. There is mild diffuse thickened appearance of the colon which may be related to underdistention but suspicious for pancolitis. Clinical correlation recommended. Liquid content within the colon may represent  diarrheal state or related to recent colonic prep. There is no bowel obstruction. The appendix is normal. Vascular/Lymphatic: Mild aortoiliac atherosclerotic disease. The IVC is unremarkable. No portal venous gas. There is no adenopathy. Reproductive: The uterus is grossly unremarkable. No suspicious adnexal masses. Other: Partially visualized left breast implant. Musculoskeletal: No acute osseous pathology. IMPRESSION: 1. Findings suspicious for pancolitis. No bowel obstruction. Normal appendix. 2.  Aortic Atherosclerosis (ICD10-I70.0). Electronically Signed   By: Vanetta Chou M.D.   On: 11/28/2023 21:33     Procedures   Medications Ordered in the ED  HYDROcodone -acetaminophen  (NORCO/VICODIN) 5-325 MG per tablet 2 tablet (has no administration in time range)  sodium chloride 0.9 % bolus 1,000 mL (0 mLs Intravenous Stopped 11/28/23 2116)  ondansetron (ZOFRAN) injection 4 mg (4 mg Intravenous Given 11/28/23 2012)  morphine (PF) 4 MG/ML injection 4 mg (4 mg Intravenous Given 11/28/23 2012)  iohexol  (OMNIPAQUE ) 300 MG/ML solution 100 mL (100 mLs Intravenous Contrast Given 11/28/23 2115)  metoCLOPramide (REGLAN) injection 10 mg (10 mg Intravenous Given 11/28/23 2232)  methylPREDNISolone sodium succinate (SOLU-MEDROL) 125 mg/2 mL injection 125 mg (125 mg Intravenous Given 11/28/23 2228)                                    Medical Decision Making Amount and/or Complexity of Data Reviewed Labs: ordered. Radiology: ordered.  Risk Prescription drug management.   This patient presents to the ED for concern of abdominal pain, vomiting, this involves an extensive number of treatment options, and is a complaint that carries with it a high risk of complications and morbidity.  I considered the following differential and admission for this acute, potentially life threatening condition.  The differential diagnosis includes ulcerative colitis, bowel obstruction, ischemic colitis, pancreatitis,  appendicitis  MDM:    Patient is a 58 year old with a history of ulcerative colitis which is well-managed on Humira.  She recently started a 2-day colonoscopy prep for colonoscopy and this seems to have triggered a bout of ulcerative colitis.  She has developed nausea and vomiting with pain in her left abdomen which is similar to her prior ulcerative colitis discomfort.  She is not febrile.  Her vital signs are stable.  Labs are nonconcerning.  WBC count is normal.  She does not have any ongoing diarrhea which would be more concerning for infectious etiology.  No bloody stools.  CT scan shows some possible pancolitis versus decompressed bowel.  She was given IV fluids and pain medication here.  Overall is feeling better.  She tolerated p.o. challenge.  Discussed starting a short course of steroids which she is wanting to do.  She was given a dose of Solu-Medrol here and will discharge her with a 4-day  course of prednisone.  Was encouraged to contact her gastroenterologist tomorrow for close follow-up.  Return precautions were given.  (Labs, imaging, consults)  Labs: I Ordered, and personally interpreted labs.  The pertinent results include: Normal WBC count, mild hyperglycemia  Imaging Studies ordered: I ordered imaging studies including CT abdomen pelvis I independently visualized and interpreted imaging. I agree with the radiologist interpretation  Additional history obtained from husband at bedside.  External records from outside source obtained and reviewed including history  Cardiac Monitoring: The patient was not maintained on a cardiac monitor.  If on the cardiac monitor, I personally viewed and interpreted the cardiac monitored which showed an underlying rhythm of:    Reevaluation: After the interventions noted above, I reevaluated the patient and found that they have :improved  Social Determinants of Health:    Disposition: Discharged to home  Co morbidities that complicate the  patient evaluation  Past Medical History:  Diagnosis Date   Anxiety    Asthma    Depression    Elevated lipids    Fibromyalgia    Osteoarthritis    UC (ulcerative colitis) (HCC)      Medicines Meds ordered this encounter  Medications   sodium chloride 0.9 % bolus 1,000 mL   ondansetron (ZOFRAN) injection 4 mg   morphine (PF) 4 MG/ML injection 4 mg   iohexol  (OMNIPAQUE ) 300 MG/ML solution 100 mL   metoCLOPramide (REGLAN) injection 10 mg   methylPREDNISolone sodium succinate (SOLU-MEDROL) 125 mg/2 mL injection 125 mg   predniSONE (DELTASONE) 20 MG tablet    Sig: 2 tabs po daily x 4 days    Dispense:  8 tablet    Refill:  0   HYDROcodone -acetaminophen  (NORCO/VICODIN) 5-325 MG per tablet 2 tablet    Refill:  0   HYDROcodone -acetaminophen  (NORCO/VICODIN) 5-325 MG tablet    Sig: Take 1-2 tablets by mouth every 4 (four) hours as needed.    Dispense:  12 tablet    Refill:  0    I have reviewed the patients home medicines and have made adjustments as needed  Problem List / ED Course: Problem List Items Addressed This Visit   None Visit Diagnoses       Colitis    -  Primary                Final diagnoses:  Colitis    ED Discharge Orders          Ordered    predniSONE (DELTASONE) 20 MG tablet        11/28/23 2208    HYDROcodone -acetaminophen  (NORCO/VICODIN) 5-325 MG tablet  Every 4 hours PRN        11/28/23 2342               Lenor Hollering, MD 11/28/23 2346

## 2023-11-28 NOTE — Discharge Instructions (Addendum)
 Call your gastroenterologist tomorrow for any further instructions.  Return to the emergency room if you have any worsening symptoms.

## 2024-01-10 IMAGING — CT CT ABD-PELV W/ CM
2 of 5 series · 16 of 46 positions shown, 18 images · IV contrast (Omnipaque)
Comparison: None.

CLINICAL DATA: Abdominal trauma, blunt

EXAM:
CT ABDOMEN AND PELVIS WITH CONTRAST
TECHNIQUE: Multidetector CT imaging of the abdomen and pelvis was performed
using the standard protocol following bolus administration of
intravenous contrast.

[Series 2: axial st · axial · 0.75mm/px · z∈[+728,+1108]mm · 13 of 86 slices shown, 15 images]
[im 5/86  soft-tissue]
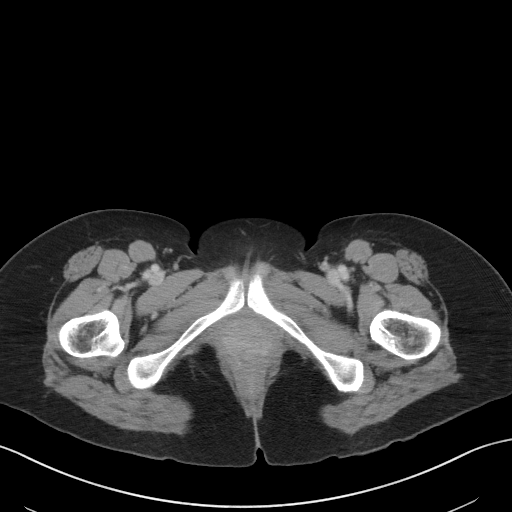
[im 5/86  bone]
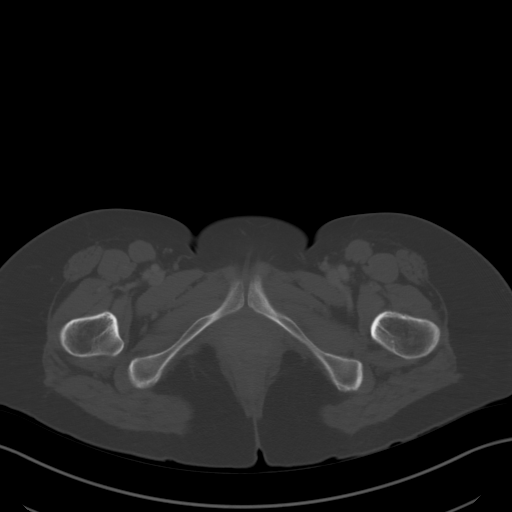
[im 13/86  soft-tissue]
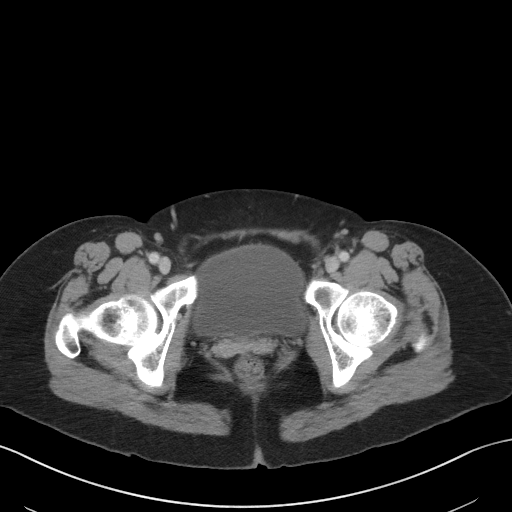
[im 18/86  soft-tissue]
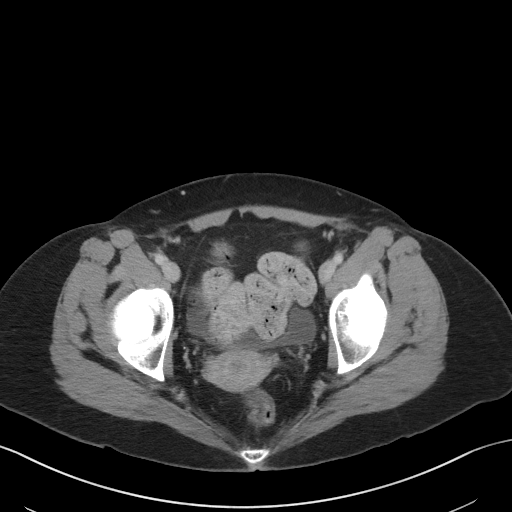
[im 26/86  soft-tissue]
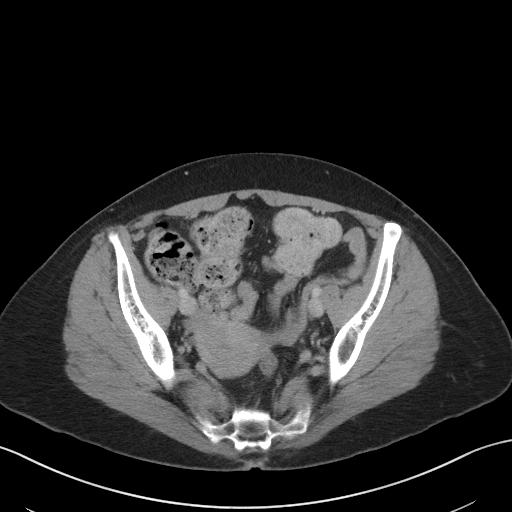
[im 30/86  soft-tissue]
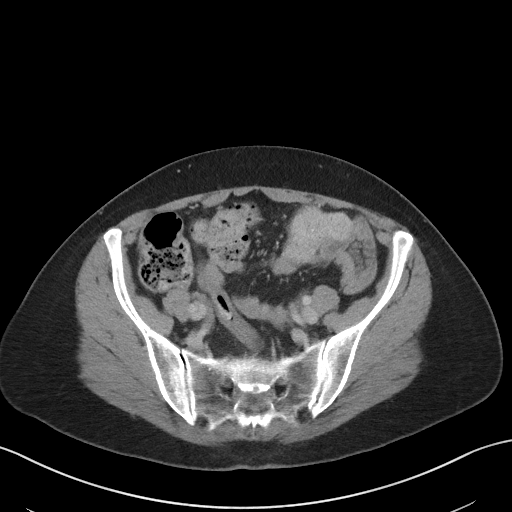
[im 39/86  soft-tissue]
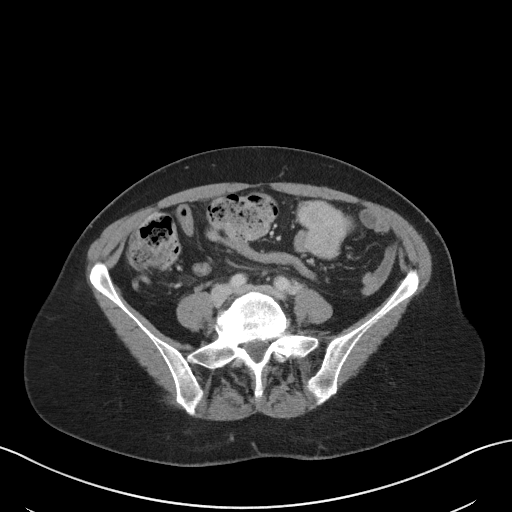
[im 43/86  soft-tissue]
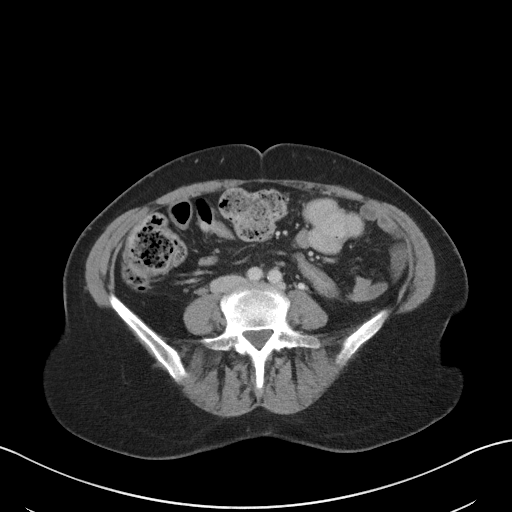
[im 47/86  soft-tissue]
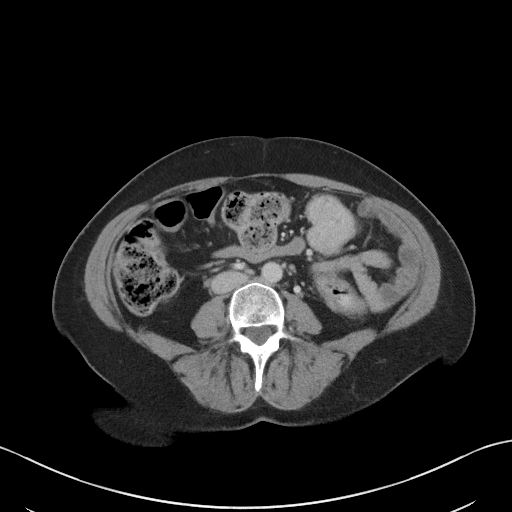
[im 56/86  soft-tissue]
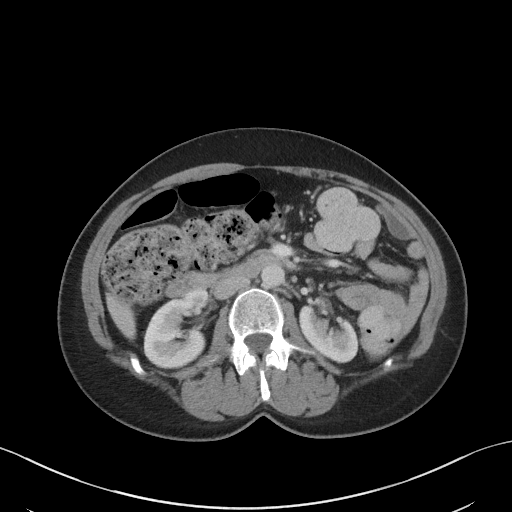
[im 56/86  bone]
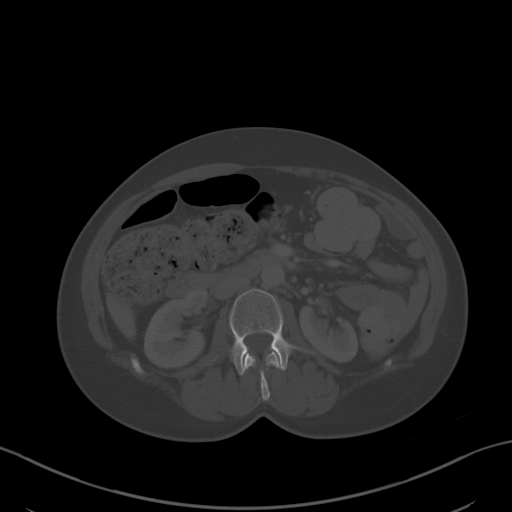
[im 60/86  soft-tissue]
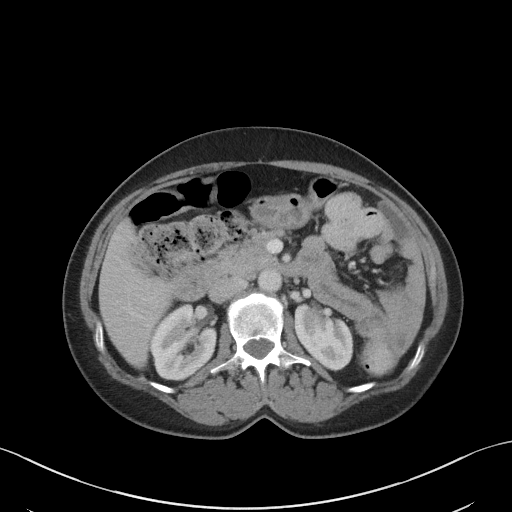
[im 69/86  soft-tissue]
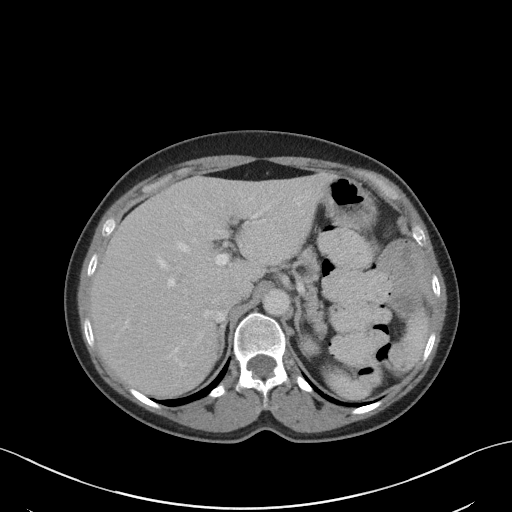
[im 73/86  soft-tissue]
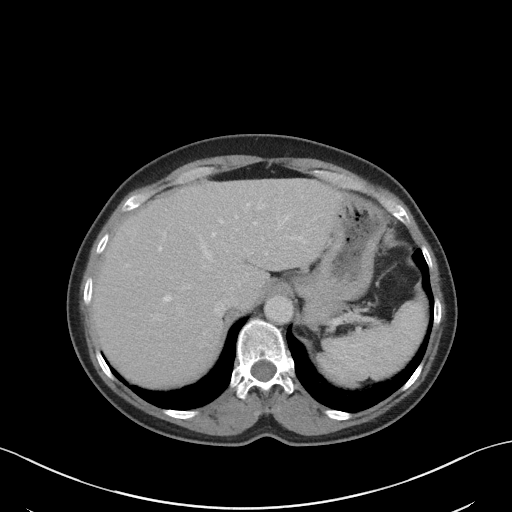
[im 81/86  soft-tissue]
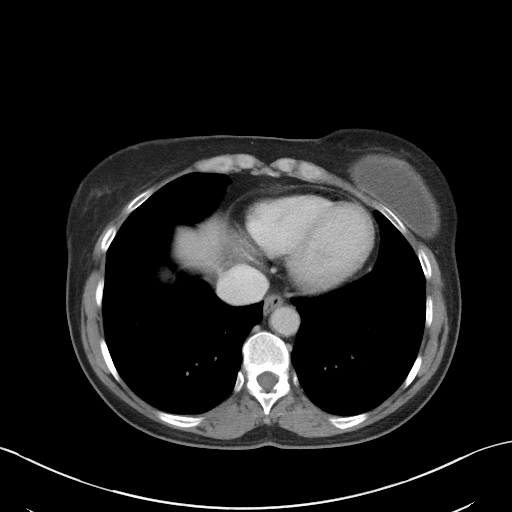

[Series 5: coronal st · coronal · 0.77mm/px · 3 of 83 slices shown]
[im 28/83  soft-tissue]
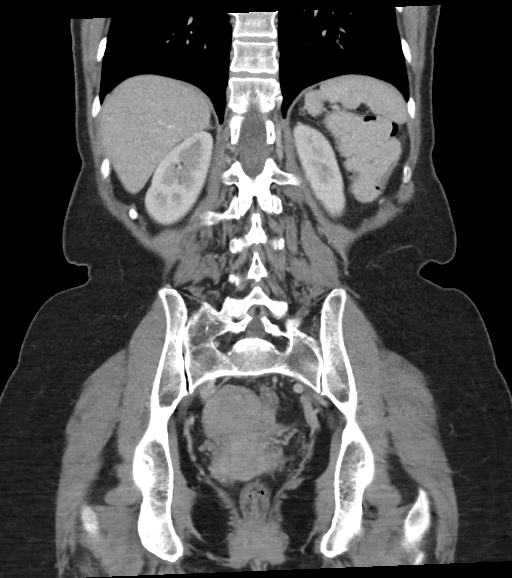
[im 37/83  soft-tissue]
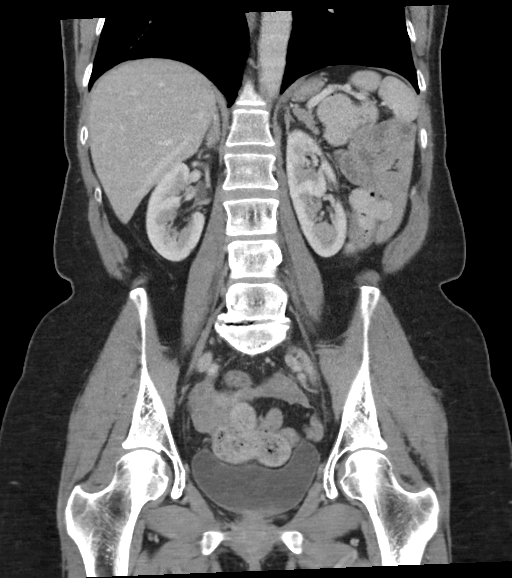
[im 46/83  soft-tissue]
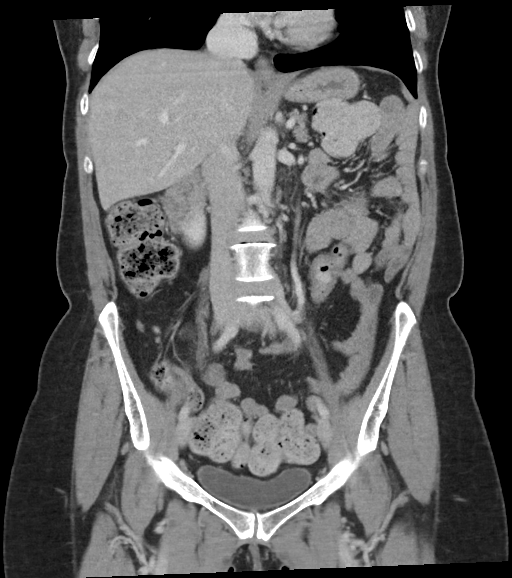

[16 of 46 positions shown; findings below may reference images not displayed]

RADIATION DOSE REDUCTION: This exam was performed according to the
departmental dose-optimization program which includes automated
exposure control, adjustment of the mA and/or kV according to
patient size and/or use of iterative reconstruction technique.

CONTRAST:  100mL OMNIPAQUE IOHEXOL 300 MG/ML  SOLN
FINDINGS: Lower chest: No acute abnormality. Bilateral breast implants
partially visualized.

Hepatobiliary: No focal liver abnormality is seen. The gallbladder
is unremarkable.

Pancreas: Unremarkable. No pancreatic ductal dilatation or
surrounding inflammatory changes.

Spleen: Spleen is normal in size with multiple clefts. Probable
large adjacent splenule.

Adrenals/Urinary Tract: Adrenal glands are unremarkable. No
hydronephrosis or nephrolithiasis. Bladder is unremarkable.

Stomach/Bowel: The stomach is within normal limits. There is no
evidence of bowel obstruction.The appendix is normal. Redundant
sigmoid colon. Moderate stool burden in the ascending and transverse
colon.

Vascular/Lymphatic: No significant vascular findings are present. No
enlarged abdominal or pelvic lymph nodes.

Reproductive: Unremarkable.

Other: No abdominal wall hernia or abnormality. No abdominopelvic
ascites.

Musculoskeletal: Moderate to severe disc height loss at L5-S1. There
is no acute osseous abnormality. No suspicious lytic or blastic
lesions.
IMPRESSION: No acute abdominopelvic abnormality.
